# Patient Record
Sex: Male | Born: 1990
Health system: Southern US, Community
[De-identification: ages and names within clinical notes are randomized; demographics above are authoritative.]

---
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
## 2010-02-26 ENCOUNTER — Emergency Department (HOSPITAL_COMMUNITY): Admission: EM | Admit: 2010-02-26 | Discharge: 2010-02-27 | Payer: Self-pay | Admitting: Emergency Medicine

## 2011-02-10 LAB — URINALYSIS, ROUTINE W REFLEX MICROSCOPIC
Glucose, UA: NEGATIVE mg/dL
Hgb urine dipstick: NEGATIVE
Specific Gravity, Urine: 1.02 (ref 1.005–1.030)

## 2011-02-10 LAB — BASIC METABOLIC PANEL
BUN: 7 mg/dL (ref 6–23)
CO2: 25 mEq/L (ref 19–32)
Chloride: 109 mEq/L (ref 96–112)
Creatinine, Ser: 1.25 mg/dL (ref 0.4–1.5)

## 2011-02-10 LAB — ACETAMINOPHEN LEVEL: Acetaminophen (Tylenol), Serum: 22.6 ug/mL (ref 10–30)

## 2011-02-10 LAB — HEPATIC FUNCTION PANEL
ALT: 12 U/L (ref 0–53)
AST: 20 U/L (ref 0–37)
Indirect Bilirubin: 0.4 mg/dL (ref 0.3–0.9)
Total Protein: 7.2 g/dL (ref 6.0–8.3)

## 2011-02-10 LAB — SALICYLATE LEVEL: Salicylate Lvl: <4 mg/dL (ref 2.8–20.0)

## 2011-02-10 LAB — CBC
MCHC: 34.5 g/dL (ref 30.0–36.0)
MCV: 90.1 fL (ref 78.0–100.0)
Platelets: 241 10*3/uL (ref 150–400)
WBC: 5.9 10*3/uL (ref 4.0–10.5)

## 2011-02-10 LAB — ETHANOL: Alcohol, Ethyl (B): <5 mg/dL (ref 0–10)

## 2011-02-10 LAB — DIFFERENTIAL
Basophils Absolute: 0 10*3/uL (ref 0.0–0.1)
Basophils Relative: 0 % (ref 0–1)
Eosinophils Absolute: 0 10*3/uL (ref 0.0–0.7)
Neutrophils Relative %: 58 % (ref 43–77)

## 2011-02-10 LAB — RAPID URINE DRUG SCREEN, HOSP PERFORMED: Barbiturates: NOT DETECTED

## 2011-07-11 ENCOUNTER — Encounter: Payer: Self-pay | Admitting: *Deleted

## 2011-07-11 ENCOUNTER — Emergency Department (HOSPITAL_COMMUNITY)
Admission: EM | Admit: 2011-07-11 | Discharge: 2011-07-11 | Disposition: A | Discharge status: Home / Self Care | Payer: Self-pay | Attending: Emergency Medicine | Admitting: Emergency Medicine

## 2011-07-11 DIAGNOSIS — F17200 Nicotine dependence, unspecified, uncomplicated: Secondary | ICD-10-CM | POA: Insufficient documentation

## 2011-07-11 DIAGNOSIS — Z202 Contact with and (suspected) exposure to infections with a predominantly sexual mode of transmission: Secondary | ICD-10-CM | POA: Insufficient documentation

## 2011-07-11 LAB — URINALYSIS, ROUTINE W REFLEX MICROSCOPIC
Bilirubin Urine: NEGATIVE
Nitrite: NEGATIVE
Specific Gravity, Urine: >1.03 — ABNORMAL HIGH (ref 1.005–1.030)
pH: 6 (ref 5.0–8.0)

## 2011-07-11 MED ORDER — AZITHROMYCIN 250 MG PO TABS
1000.0000 mg | ORAL_TABLET | Freq: Once | ORAL | Status: AC
  Administered 2011-07-11: 1000 mg via ORAL
  Filled 2011-07-11: qty 4

## 2011-07-11 MED ORDER — CEFTRIAXONE SODIUM 1 G IJ SOLR
500.0000 mg | Freq: Once | INTRAMUSCULAR | Status: AC
  Administered 2011-07-11: 500 mg via INTRAMUSCULAR
  Filled 2011-07-11: qty 1
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
## 2011-07-13 LAB — GC/CHLAMYDIA PROBE AMP, GENITAL
Chlamydia, DNA Probe: POSITIVE — AB
GC Probe Amp, Genital: NEGATIVE

## 2011-07-14 NOTE — ED Provider Notes (Signed)
Medical screening examination/treatment/procedure(s) were performed by non-physician practitioner and as supervising physician I was immediately available for consultation/collaboration.   Benny Lennert, MD 07/14/11 [phone]

## 2011-07-21 NOTE — ED Provider Notes (Signed)
Medical screening examination/treatment/procedure(s) were performed by non-physician practitioner and as supervising physician I was immediately available for consultation/collaboration.   Benny Lennert, MD 07/21/11 1043

## 2012-01-19 ENCOUNTER — Emergency Department (HOSPITAL_COMMUNITY): Payer: Self-pay

## 2012-01-19 ENCOUNTER — Encounter (HOSPITAL_COMMUNITY): Payer: Self-pay | Admitting: Emergency Medicine

## 2012-01-19 ENCOUNTER — Emergency Department (HOSPITAL_COMMUNITY)
Admission: EM | Admit: 2012-01-19 | Discharge: 2012-01-19 | Disposition: A | Discharge status: Home / Self Care | Payer: Self-pay | Attending: Emergency Medicine | Admitting: Emergency Medicine

## 2012-01-19 DIAGNOSIS — F17200 Nicotine dependence, unspecified, uncomplicated: Secondary | ICD-10-CM | POA: Insufficient documentation

## 2012-01-19 DIAGNOSIS — M25579 Pain in unspecified ankle and joints of unspecified foot: Secondary | ICD-10-CM | POA: Insufficient documentation

## 2012-01-19 DIAGNOSIS — S93409A Sprain of unspecified ligament of unspecified ankle, initial encounter: Secondary | ICD-10-CM | POA: Insufficient documentation

## 2012-01-19 DIAGNOSIS — X500XXA Overexertion from strenuous movement or load, initial encounter: Secondary | ICD-10-CM | POA: Insufficient documentation

## 2012-01-19 DIAGNOSIS — Y92009 Unspecified place in unspecified non-institutional (private) residence as the place of occurrence of the external cause: Secondary | ICD-10-CM | POA: Insufficient documentation

## 2012-01-19 MED ORDER — IBUPROFEN 800 MG PO TABS: 800.0000 mg | ORAL_TABLET | Freq: Three times a day (TID) | ORAL | Status: AC

## 2012-01-19 MED ORDER — HYDROCODONE-ACETAMINOPHEN 5-325 MG PO TABS: ORAL_TABLET | ORAL | Status: AC

## 2012-01-19 NOTE — Discharge Instructions (Signed)
Ankle Sprain °An ankle sprain is an injury to the ligaments that hold the ankle joint together.  °CAUSES °The injury is usually caused by a fall or by twisting the ankle. It is important to tell your caregiver how the injury occurred and whether or not you were able to walk immediately after the injury.  °SYMPTOMS  °Pain is the primary symptom. It may be present at rest or only when you are trying to stand or walk. The ankle will likely be swollen. Bruising may develop immediately or after 1 or 2 days. It may be difficult or impossible to stand or walk. This depends on the severity of the sprain. °DIAGNOSIS  °Your caregiver can determine if a sprain has occurred based on the accident details and on examination of your ankle. Examination will include pressing and squeezing areas of the foot and ankle. Your caregiver will try to move the ankle in certain ways. X-rays may be used to be sure a bone was not broken, or that the ligament did not pull off of a bone (avulsion). There are standard guidelines that can reliably determine if an X-ray is needed. °TREATMENT  °Rest, ice, elevation, and compression are the basic modes of treatment. Certain types of braces can help stabilize the ankle and allow early return to walking. Your caregiver can make a recommendation for this. Medication may be recommended for pain. You may be referred to an orthopedist or a physical therapist for certain types of severe sprains. °HOME CARE INSTRUCTIONS  °· Apply ice to the sore area for 15 to 20 minutes, 3 to 4 times per day. Do this while you are awake for the first 2 days, or as directed. This can be stopped when the swelling goes away. Put the ice in a plastic bag and place a towel between the bag of ice and your skin.  °· Keep your leg elevated when possible to lessen swelling.  °· If your caregiver recommends crutches, use them as instructed with a non-weight bearing cast for 1 week. Then, you may walk on your ankle as the pain allows,  or as instructed. Gradually, put weight on the affected ankle. Continue to use crutches or a cane until you can walk without causing pain.  °· If a plaster splint was applied, wear the splint until you are seen for a follow-up examination. Rest it on nothing harder than a pillow the first 24 hours. Do not put weight on it. Do not get it wet. You may take it off to take a shower or bath.  °· You may have been given an elastic bandage to use with the plaster splint, or you may have been given a elastic bandage to use alone. The elastic bandage is too tight if you have numbness, tingling, or if your foot becomes cold and blue. Adjust the bandage to make it comfortable.  °· If an air splint was applied, you may blow more air into it or take some out to make it more comfortable. You may take it off at night and to take a shower or bath. Wiggle your toes in the splint several times per day if you are able.  °· Only take over-the-counter or prescription medicines for pain, discomfort, or fever as directed by your caregiver.  °· Do not drive a vehicle until your caregiver specifically tells you it is safe to do so.  °SEEK MEDICAL CARE IF:  °· You have an increase in bruising, swelling, or pain.  °· Your   toes feel cold.  °· Pain relief is not achieved with medications.  °SEEK IMMEDIATE MEDICAL CARE IF: °Your toes are numb or blue or you have severe pain. °MAKE SURE YOU:  °· Understand these instructions.  °· Will watch your condition.  °· Will get help right away if you are not doing well or get worse.  °Document Released: 11/08/2005 Document Revised: 02/12/2011 Document Reviewed: 06/12/2008 °[employer]® Patient Information ©2012 [employer]. °

## 2012-01-19 NOTE — ED Provider Notes (Signed)
History     CSN: [id number]  Arrival date & time 01/19/12  1844   First MD Initiated Contact with Patient 01/19/12 1944      Chief Complaint  Patient presents with  . Extremity Pain    (Consider location/radiation/quality/duration/timing/severity/associated sxs/prior treatment) HPI Comments: Patient c/o pain to his right ankle for one week.  Pain began after he stepped in a hole.  He denies other injuries.  Patient is a 21 y.o. male presenting with ankle pain. The history is provided by the patient. No language interpreter was used.  Ankle Pain  The incident occurred more than 1 week ago. The incident occurred at home. The injury mechanism was torsion. The pain is present in the right ankle. The pain is mild. The pain has been constant since onset. Pertinent negatives include no numbness, no inability to bear weight, no loss of motion, no muscle weakness, no loss of sensation and no tingling. He reports no foreign bodies present. The symptoms are aggravated by activity, bearing weight and palpation. He has tried nothing for the symptoms. The treatment provided no relief.    History reviewed. No pertinent past medical history.  History reviewed. No pertinent past surgical history.  History reviewed. No pertinent family history.  History  Substance Use Topics  . Smoking status: Current Everyday Smoker -- 1.0 packs/day  . Smokeless tobacco: Not on file  . Alcohol Use: No      Review of Systems  Musculoskeletal: Positive for arthralgias. Negative for joint swelling and gait problem.  Skin: Negative.   Neurological: Negative for tingling, weakness and numbness.  All other systems reviewed and are negative.    Allergies  Review of patient's allergies indicates no known allergies.  Home Medications  No current outpatient prescriptions on file.  BP 111/64  Pulse 64  Temp(Src) 98.5 F (36.9 C) (Oral)  Resp 20  Ht 5\' 6"  (1.676 m)  Wt 135 lb (61.236 kg)  BMI 21.79 kg/m2   SpO2 100%  Physical Exam  Nursing note and vitals reviewed. Constitutional: He is oriented to person, place, and time. He appears well-developed and well-nourished. No distress.  HENT:  Head: Normocephalic and atraumatic.  Musculoskeletal: He exhibits tenderness. He exhibits no edema.       Right ankle: He exhibits normal range of motion, no swelling, no ecchymosis, no deformity, no laceration and normal pulse. tenderness. Lateral malleolus tenderness found. No medial malleolus and no proximal fibula tenderness found. Achilles tendon exhibits pain. Achilles tendon exhibits no defect and normal Thompson's test results.       Feet:  Neurological: He is alert and oriented to person, place, and time. He exhibits normal muscle tone. Coordination normal.  Skin: Skin is warm and dry.    ED Course  Procedures (including critical care time)  Labs Reviewed - No data to display Dg Ankle Complete Right  01/19/2012  *RADIOLOGY REPORT*  Clinical Data: 21 year old male with trauma 1 week ago and continued lateral ankle pain.  RIGHT ANKLE - COMPLETE 3+ VIEW  Comparison: None.  Findings: No evidence of joint effusion.  Calcaneus intact.  Normal mortise joint alignment.  Talar dome intact.  No fracture dislocation identified.  IMPRESSION: No acute fracture or dislocation identified about the right ankle.  Original Report Authenticated By: Ulla Potash III, M.D.      ASO splint applied to the right ankle.  Pain improved.  No edema. Remains NV intact.    MDM      Patient has tenderness to  palpation of the lateral right ankle and along the Achilles tendon. There's no obvious Achilles tendon rupture. Patient has full dorsiflexion and plantar flexion of the right foot. DP pulse is brisk, distal sensation intact.  ASO has been applied and pain is improved he agrees to close followup with Dr. Mort Sawyers office  Pt feels improved after observation and/or treatment in ED. Patient / Family / Caregiver  understand and agree with initial ED impression and plan with expectations set for ED visit. Pt stable in ED with no significant deterioration in condition.   Herb Beltre L. [place], [place] 01/21/12 2142

## 2012-01-19 NOTE — ED Notes (Signed)
States he stepped in a hole over a week ago, pain in right ankle

## 2012-01-21 NOTE — ED Provider Notes (Signed)
Medical screening examination/treatment/procedure(s) were performed by non-physician practitioner and as supervising physician I was immediately available for consultation/collaboration.  Markham Dumlao R. Wynelle Dreier, MD 01/21/12 2347 

## 2012-05-27 ENCOUNTER — Encounter (HOSPITAL_COMMUNITY): Payer: Self-pay | Admitting: *Deleted

## 2012-05-27 ENCOUNTER — Emergency Department (HOSPITAL_COMMUNITY)
Admission: EM | Admit: 2012-05-27 | Discharge: 2012-05-27 | Disposition: A | Discharge status: Home / Self Care | Payer: Self-pay | Attending: Emergency Medicine | Admitting: Emergency Medicine

## 2012-05-27 DIAGNOSIS — L089 Local infection of the skin and subcutaneous tissue, unspecified: Secondary | ICD-10-CM | POA: Insufficient documentation

## 2012-05-27 DIAGNOSIS — M79672 Pain in left foot: Secondary | ICD-10-CM

## 2012-05-27 DIAGNOSIS — F17200 Nicotine dependence, unspecified, uncomplicated: Secondary | ICD-10-CM | POA: Insufficient documentation

## 2012-05-27 MED ORDER — SULFAMETHOXAZOLE-TMP DS 800-160 MG PO TABS
1.0000 | ORAL_TABLET | Freq: Once | ORAL | Status: AC
  Administered 2012-05-27: 1 via ORAL
  Filled 2012-05-27: qty 1

## 2012-05-27 MED ORDER — SULFAMETHOXAZOLE-TRIMETHOPRIM 800-160 MG PO TABS: 1.0000 | ORAL_TABLET | Freq: Two times a day (BID) | ORAL | Status: AC

## 2012-05-27 NOTE — ED Notes (Signed)
Pt reports that he believes 2 toes on left foot are infected.  No swelling or redness noted to area, nail on great toe cut very short. Reports pain began about 1 week ago. Reports he did have some drainage yesterday.

## 2012-05-27 NOTE — ED Provider Notes (Signed)
History     CSN: [id number]  Arrival date & time 05/27/12  2017   First MD Initiated Contact with Patient 05/27/12 2109      No chief complaint on file.   (Consider location/radiation/quality/duration/timing/severity/associated sxs/prior treatment) HPI Comments: Patient reports problem with the left first toe area patient states that this pain started approximately a week ago after he cut his toenail. He thinks he may have cut in a little bit too short. In the last few days he has noted some mild drainage from the area. He became concerned about infection and presents to the emergency department. The patient also noted some swelling of the tip of the second toe of the left foot. He does not recall any injury. He noticed that the toenail is black. And he requests to have this evaluated as well. There's been no fever or chills. There's been no nausea vomiting. No previous procedures involving the left foot.  The history is provided by the patient.    History reviewed. No pertinent past medical history.  History reviewed. No pertinent past surgical history.  History reviewed. No pertinent family history.  History  Substance Use Topics  . Smoking status: Current Everyday Smoker -- 1.0 packs/day  . Smokeless tobacco: Not on file  . Alcohol Use: No      Review of Systems  Constitutional: Negative for activity change.       All ROS Neg except as noted in HPI  HENT: Negative for nosebleeds and neck pain.   Eyes: Negative for photophobia and discharge.  Respiratory: Negative for cough, shortness of breath and wheezing.   Cardiovascular: Negative for chest pain and palpitations.  Gastrointestinal: Negative for abdominal pain and blood in stool.  Genitourinary: Negative for dysuria, frequency and hematuria.  Musculoskeletal: Negative for back pain and arthralgias.  Skin: Negative.   Neurological: Negative for dizziness, seizures and speech difficulty.  Psychiatric/Behavioral:  Negative for hallucinations and confusion.    Allergies  Review of patient's allergies indicates no known allergies.  Home Medications   Current Outpatient Rx  Name Route Sig Dispense Refill  . SULFAMETHOXAZOLE-TRIMETHOPRIM 800-160 MG PO TABS Oral Take 1 tablet by mouth every 12 (twelve) hours. 14 tablet 0    BP 130/70  Pulse 67  Temp 97.3 F (36.3 C) (Oral)  Resp 20  Ht 5\' 6"  (1.676 m)  Wt 135 lb (61.236 kg)  BMI 21.79 kg/m2  SpO2 99%  Physical Exam  Nursing note and vitals reviewed. Constitutional: He is oriented to person, place, and time. He appears well-developed and well-nourished.  Non-toxic appearance.  HENT:  Head: Normocephalic.  Right Ear: Tympanic membrane and external ear normal.  Left Ear: Tympanic membrane and external ear normal.  Eyes: EOM and lids are normal. Pupils are equal, round, and reactive to light.  Neck: Normal range of motion. Neck supple. Carotid bruit is not present.  Cardiovascular: Normal rate, regular rhythm, normal heart sounds, intact distal pulses and normal pulses.   Pulmonary/Chest: Breath sounds normal. No respiratory distress.  Abdominal: Soft. Bowel sounds are normal. There is no tenderness. There is no guarding.  Musculoskeletal: Normal range of motion.       There is increased redness and some raw exposed area under the nail of the left first toe. There is no red streaking present. There is no drainage present at this time there is full range of motion of the toe. There no lesions between the toes. The left second toe is Mildly swollen at the distal  portion. The nail is black but there is no change in the tissue around the nail. There is good capillary refill. There is no red streaking present. The dorsalis pedis pulse is 2+ and symmetrical  Lymphadenopathy:       Head (right side): No submandibular adenopathy present.       Head (left side): No submandibular adenopathy present.    He has no cervical adenopathy.  Neurological: He is  alert and oriented to person, place, and time. He has normal strength. No cranial nerve deficit or sensory deficit.  Skin: Skin is warm and dry.  Psychiatric: He has a normal mood and affect. His speech is normal.    ED Course  Procedures (including critical care time)  Labs Reviewed - No data to display No results found.   1. Foot pain, left   2. Skin infection       MDM  I have reviewed nursing notes, vital signs, and all appropriate lab and imaging results for this patient. Patient has an early infection under the nail of the left first toe. And some swelling of the left second toe. Patient is placed on Septra DS one twice daily. He is asked to soak the left foot in warm salt water daily. And he is advised to return to the emergency department or see his primary care physician if not improving.       Kathie Dike, [place] 05/27/12 2128

## 2012-05-28 NOTE — ED Provider Notes (Signed)
Medical screening examination/treatment/procedure(s) were performed by non-physician practitioner and as supervising physician I was immediately available for consultation/collaboration.   Laray Anger, DO 05/28/12 [phone]

## 2012-08-17 ENCOUNTER — Encounter (HOSPITAL_COMMUNITY): Payer: Self-pay

## 2012-08-17 ENCOUNTER — Emergency Department (HOSPITAL_COMMUNITY): Payer: Self-pay

## 2012-08-17 ENCOUNTER — Emergency Department (HOSPITAL_COMMUNITY)
Admission: EM | Admit: 2012-08-17 | Discharge: 2012-08-17 | Disposition: A | Discharge status: Home / Self Care | Payer: Self-pay | Attending: Emergency Medicine | Admitting: Emergency Medicine

## 2012-08-17 DIAGNOSIS — S63502A Unspecified sprain of left wrist, initial encounter: Secondary | ICD-10-CM

## 2012-08-17 DIAGNOSIS — S0181XA Laceration without foreign body of other part of head, initial encounter: Secondary | ICD-10-CM

## 2012-08-17 DIAGNOSIS — S0180XA Unspecified open wound of other part of head, initial encounter: Secondary | ICD-10-CM | POA: Insufficient documentation

## 2012-08-17 DIAGNOSIS — S63509A Unspecified sprain of unspecified wrist, initial encounter: Secondary | ICD-10-CM | POA: Insufficient documentation

## 2012-08-17 MED ORDER — IBUPROFEN 800 MG PO TABS
800.0000 mg | ORAL_TABLET | Freq: Once | ORAL | Status: AC
  Administered 2012-08-17: 800 mg via ORAL
  Filled 2012-08-17: qty 1

## 2012-08-17 MED ORDER — PROMETHAZINE HCL 12.5 MG PO TABS
12.5000 mg | ORAL_TABLET | Freq: Once | ORAL | Status: AC
  Administered 2012-08-17: 12.5 mg via ORAL

## 2012-08-17 MED ORDER — HYDROCODONE-ACETAMINOPHEN 7.5-325 MG PO TABS: 1.0000 | ORAL_TABLET | ORAL | Status: AC | PRN

## 2012-08-17 MED ORDER — TETANUS-DIPHTH-ACELL PERTUSSIS 5-2.5-18.5 LF-MCG/0.5 IM SUSP
0.5000 mL | Freq: Once | INTRAMUSCULAR | Status: AC
  Administered 2012-08-17: 0.5 mL via INTRAMUSCULAR
  Filled 2012-08-17: qty 0.5

## 2012-08-17 MED ORDER — IBUPROFEN 800 MG PO TABS: 800.0000 mg | ORAL_TABLET | Freq: Three times a day (TID) | ORAL | Status: AC

## 2012-08-17 MED ORDER — HYDROCODONE-ACETAMINOPHEN 5-325 MG PO TABS
2.0000 | ORAL_TABLET | Freq: Once | ORAL | Status: AC
  Administered 2012-08-17: 2 via ORAL
  Filled 2012-08-17: qty 2
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
## 2012-08-17 NOTE — ED Notes (Signed)
Patient with no complaints at this time. Respirations even and unlabored. Skin warm/dry. Discharge instructions reviewed with patient at this time. Patient given opportunity to voice concerns/ask questions. Patient discharged at this time and left Emergency Department with steady gait.   

## 2012-08-17 NOTE — ED Notes (Signed)
L wrist w/obvious deformity, swollen and tender.  Also has laceration to L chin.
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
## 2012-08-19 NOTE — ED Provider Notes (Signed)
Medical screening examination/treatment/procedure(s) were performed by non-physician practitioner and as supervising physician I was immediately available for consultation/collaboration.  Shanielle Correll L Gearldean [place], MD 08/19/12 1258 

## 2013-11-11 ENCOUNTER — Emergency Department (HOSPITAL_COMMUNITY)
Admission: EM | Admit: 2013-11-11 | Discharge: 2013-11-11 | Disposition: A | Discharge status: Home / Self Care | Payer: Self-pay | Attending: Emergency Medicine | Admitting: Emergency Medicine

## 2013-11-11 ENCOUNTER — Encounter (HOSPITAL_COMMUNITY): Payer: Self-pay | Admitting: Emergency Medicine

## 2013-11-11 DIAGNOSIS — Z791 Long term (current) use of non-steroidal anti-inflammatories (NSAID): Secondary | ICD-10-CM | POA: Insufficient documentation

## 2013-11-11 DIAGNOSIS — F17200 Nicotine dependence, unspecified, uncomplicated: Secondary | ICD-10-CM | POA: Insufficient documentation

## 2013-11-11 DIAGNOSIS — K089 Disorder of teeth and supporting structures, unspecified: Secondary | ICD-10-CM | POA: Insufficient documentation

## 2013-11-11 DIAGNOSIS — K029 Dental caries, unspecified: Secondary | ICD-10-CM

## 2013-11-11 MED ORDER — HYDROCODONE-ACETAMINOPHEN 5-325 MG PO TABS: 1.0000 | ORAL_TABLET | Freq: Four times a day (QID) | ORAL | Status: DC | PRN

## 2013-11-11 MED ORDER — PENICILLIN V POTASSIUM 500 MG PO TABS: 500.0000 mg | ORAL_TABLET | Freq: Four times a day (QID) | ORAL | Status: AC

## 2013-11-11 MED ORDER — PENICILLIN V POTASSIUM 250 MG PO TABS
500.0000 mg | ORAL_TABLET | Freq: Once | ORAL | Status: AC
  Administered 2013-11-11: 500 mg via ORAL

## 2013-11-11 MED ORDER — HYDROCODONE-ACETAMINOPHEN 5-325 MG PO TABS: 1.0000 | ORAL_TABLET | Freq: Four times a day (QID) | ORAL | Status: AC | PRN

## 2013-11-11 MED ORDER — PENICILLIN V POTASSIUM 250 MG PO TABS
ORAL_TABLET | ORAL | Status: AC
  Filled 2013-11-11: qty 2

## 2013-11-11 NOTE — ED Provider Notes (Signed)
CSN: [id number]     Arrival date & time 11/11/13  0139 History   First MD Initiated Contact with Patient 11/11/13 0211     Chief Complaint  Patient presents with  . Dental Pain   (Consider location/radiation/quality/duration/timing/severity/associated sxs/prior Treatment) HPI This is a 22 year old male with a two-day history of a toothache. He states that the pain is "very bad". It is worse with percussion or closing his mouth but is not worse with eating or drinking. The pain is sharp. There is no associated fever, chills or swelling. He does not have a dentist.  History reviewed. No pertinent past medical history. History reviewed. No pertinent past surgical history. No family history on file. History  Substance Use Topics  . Smoking status: Current Every Day Smoker -- 1.00 packs/day  . Smokeless tobacco: Not on file  . Alcohol Use: No    Review of Systems  All other systems reviewed and are negative.    Allergies  Review of patient's allergies indicates no known allergies.  Home Medications   Current Outpatient Rx  Name  Route  Sig  Dispense  Refill  . ibuprofen (ADVIL,MOTRIN) 800 MG tablet   Oral   Take 1 tablet (800 mg total) by mouth 3 (three) times daily.   21 tablet   0    BP 128/85  Pulse 53  Temp(Src) 98.1 F (36.7 C) (Oral)  Resp 16  Ht 5\' 5"  (1.651 m)  Wt 127 lb (57.607 kg)  BMI 21.13 kg/m2  SpO2 100%  Physical Exam General: Well-developed, well-nourished male in no acute distress; appearance consistent with age of record HENT: normocephalic; atraumatic; carious right lower third molar with tenderness to percussion Eyes: pupils equal, round and reactive to light; extraocular muscles intact Neck: supple; no lymphadenopathy Heart: regular rate and rhythm; no murmurs, rubs or gallops Lungs: clear to auscultation bilaterally Abdomen: soft; nondistended; nontender Extremities: No deformity; full range of motion Neurologic: Awake, alert; motor  function intact in all extremities and symmetric; no facial droop Skin: Warm and dry Psychiatric: Normal mood and affect    ED Course  Procedures (including critical care time)  MDM  Patient given list of dental resources.    Hanley Seamen, MD 11/11/13 [phone]

## 2013-11-11 NOTE — ED Notes (Signed)
Discharge instructions given and reviewed with patient.  Prescriptions given for Pencillin VK and Hydrocodone; effects and use explained.  Patient verbalized understanding to follow up with dentist.  Vicodin pre-pack given at discharge.  Patient ambulatory; discharged home in good condition.

## 2013-11-11 NOTE — ED Notes (Signed)
Patient c/o right lower tooth pain x 2 days.

## 2013-11-21 MED FILL — Hydrocodone-Acetaminophen Tab 5-325 MG: ORAL | Qty: 6 | Status: AC

## 2014-11-05 ENCOUNTER — Emergency Department (HOSPITAL_COMMUNITY)
Admission: EM | Admit: 2014-11-05 | Discharge: 2014-11-06 | Disposition: A | Discharge status: Home / Self Care | Payer: Self-pay | Attending: Emergency Medicine | Admitting: Emergency Medicine

## 2014-11-05 ENCOUNTER — Emergency Department (HOSPITAL_COMMUNITY): Payer: Self-pay

## 2014-11-05 ENCOUNTER — Encounter (HOSPITAL_COMMUNITY): Payer: Self-pay | Admitting: *Deleted

## 2014-11-05 DIAGNOSIS — M25571 Pain in right ankle and joints of right foot: Secondary | ICD-10-CM

## 2014-11-05 DIAGNOSIS — Y9289 Other specified places as the place of occurrence of the external cause: Secondary | ICD-10-CM | POA: Insufficient documentation

## 2014-11-05 DIAGNOSIS — Z720 Tobacco use: Secondary | ICD-10-CM | POA: Insufficient documentation

## 2014-11-05 DIAGNOSIS — S99911A Unspecified injury of right ankle, initial encounter: Secondary | ICD-10-CM | POA: Insufficient documentation

## 2014-11-05 DIAGNOSIS — M25572 Pain in left ankle and joints of left foot: Secondary | ICD-10-CM

## 2014-11-05 DIAGNOSIS — Z791 Long term (current) use of non-steroidal anti-inflammatories (NSAID): Secondary | ICD-10-CM | POA: Insufficient documentation

## 2014-11-05 DIAGNOSIS — Y9389 Activity, other specified: Secondary | ICD-10-CM | POA: Insufficient documentation

## 2014-11-05 DIAGNOSIS — Y990 Civilian activity done for income or pay: Secondary | ICD-10-CM | POA: Insufficient documentation

## 2014-11-05 DIAGNOSIS — S99912A Unspecified injury of left ankle, initial encounter: Secondary | ICD-10-CM | POA: Insufficient documentation

## 2014-11-05 DIAGNOSIS — W228XXA Striking against or struck by other objects, initial encounter: Secondary | ICD-10-CM | POA: Insufficient documentation

## 2014-11-05 MED ORDER — IBUPROFEN 400 MG PO TABS
600.0000 mg | ORAL_TABLET | Freq: Once | ORAL | Status: AC
  Administered 2014-11-06: 600 mg via ORAL
  Filled 2014-11-05: qty 2

## 2014-11-05 NOTE — ED Notes (Signed)
bil lower leg pain for 3-4 days,injury at work, 1 week ago.

## 2014-11-05 NOTE — ED Provider Notes (Signed)
CSN: [id number]     Arrival date & time 11/05/14  2240 History   First MD Initiated Contact with Patient 11/05/14 2300     Chief Complaint  Patient presents with  . Leg Pain     (Consider location/radiation/quality/duration/timing/severity/associated sxs/prior Treatment) Patient is a 23 y.o. male presenting with leg pain. The history is provided by the patient.  Leg Pain Location:  Leg Time since incident:  4 days Injury: yes   Leg location:  L leg and R leg Pain details:    Quality:  Aching   Radiates to:  Does not radiate   Severity:  Moderate   Timing:  Constant Chronicity:  New Dislocation: no   Foreign body present:  No foreign bodies Prior injury to area:  No Relieved by:  None tried Worsened by:  Activity Ineffective treatments:  None tried  Jose Blair is a 23 y.o. male who presents to the ED with leg pain that started about a weeks ago. He states that he injured his left ankle when he was at work and a metal stand hit it. He also does a lot of walking at work and complains that both of his lower legs hurt. He denies swelling, bruising or other problems.   History reviewed. No pertinent past medical history. History reviewed. No pertinent past surgical history. History reviewed. No pertinent family history. History  Substance Use Topics  . Smoking status: Current Every Day Smoker -- 1.00 packs/day    Types: Cigarettes  . Smokeless tobacco: Not on file  . Alcohol Use: No    Review of Systems Negative except as stated in HPI   Allergies  Review of patient's allergies indicates no known allergies.  Home Medications   Prior to Admission medications   Medication Sig Start Date End Date Taking? Authorizing Provider  HYDROcodone-acetaminophen (NORCO) 5-325 MG per tablet Take 1-2 tablets by mouth every 6 (six) hours as needed (for pain). 11/11/13   Carlisle BeersJohn L Molpus, MD  HYDROcodone-acetaminophen (NORCO) 5-325 MG per tablet Take 1-2 tablets by mouth every 6  (six) hours as needed (for pain). 11/11/13   Carlisle BeersJohn L Molpus, MD  ibuprofen (ADVIL,MOTRIN) 800 MG tablet Take 1 tablet (800 mg total) by mouth 3 (three) times daily. 08/17/12   Kathie DikeHobson M Bryant, PA-C   BP 110/76 mmHg  Pulse 64  Temp(Src) 98.4 F (36.9 C) (Oral)  Resp 18  Ht 5\' 5"  (1.651 m)  Wt 129 lb (58.514 kg)  BMI 21.47 kg/m2  SpO2 100% Physical Exam  Constitutional: He is oriented to person, place, and time. He appears well-developed and well-nourished.  HENT:  Head: Normocephalic.  Eyes: EOM are normal.  Neck: Normal range of motion. Neck supple.  Cardiovascular: Normal rate.   Pulmonary/Chest: Effort normal.  Musculoskeletal:       Left ankle: He exhibits normal range of motion, no swelling, no ecchymosis, no deformity, no laceration and normal pulse. Tenderness. Lateral malleolus tenderness found. Achilles tendon normal.       Legs: Pedal pulses 2+ and equal, adequate circulation, good touch sensation. Tender on palpation bilateral lower legs and ankles. No deformity or neurovascular deficits. No calf tenderness.   Neurological: He is alert and oriented to person, place, and time. No cranial nerve deficit.  Skin: Skin is warm and dry.  Psychiatric: He has a normal mood and affect. His behavior is normal.  Nursing note and vitals reviewed.   ED Course  Procedures  Dg Ankle Complete Left  11/06/2014  CLINICAL DATA:  Acute left ankle pain after injury at work.  EXAM: LEFT ANKLE COMPLETE - 3+ VIEW  COMPARISON:  None.  FINDINGS: There is no evidence of fracture, dislocation, or joint effusion. There is no evidence of arthropathy or other focal bone abnormality. Soft tissues are unremarkable.  IMPRESSION: Normal left ankle.   Electronically Signed   By: Roque LiasJames  Green M.D.   On: 11/06/2014 01:17    MDM  23 y.o. male with left ankle pain s/p injury one week ago and bilateral ankle pain after walking a lot at work. Stable for discharge without neurovascular deficits and normal x-ray  of the left ankle. ASO to left ankle, ice, ibuprofen.   Final diagnoses:  Acute left ankle pain      Jose NapoleonHope M Martesha Niedermeier, NP 11/06/14 0154  Ward GivensIva L Knapp, MD 11/06/14 [phone]

## 2014-11-06 MED ORDER — NAPROXEN 500 MG PO TABS: 500.0000 mg | ORAL_TABLET | Freq: Two times a day (BID) | ORAL | Status: DC

## 2014-11-06 NOTE — ED Notes (Signed)
Pt left ED, ambulatory with no sign of distress. Pt verbalized discharge instructions. 

## 2015-05-14 ENCOUNTER — Encounter (HOSPITAL_COMMUNITY): Payer: Self-pay | Admitting: Emergency Medicine

## 2015-05-14 ENCOUNTER — Emergency Department (HOSPITAL_COMMUNITY)
Admission: EM | Admit: 2015-05-14 | Discharge: 2015-05-15 | Disposition: A | Discharge status: Home / Self Care | Payer: Self-pay | Attending: Emergency Medicine | Admitting: Emergency Medicine

## 2015-05-14 DIAGNOSIS — Z720 Tobacco use: Secondary | ICD-10-CM | POA: Insufficient documentation

## 2015-05-14 DIAGNOSIS — Y929 Unspecified place or not applicable: Secondary | ICD-10-CM | POA: Insufficient documentation

## 2015-05-14 DIAGNOSIS — Z791 Long term (current) use of non-steroidal anti-inflammatories (NSAID): Secondary | ICD-10-CM | POA: Insufficient documentation

## 2015-05-14 DIAGNOSIS — Y998 Other external cause status: Secondary | ICD-10-CM | POA: Insufficient documentation

## 2015-05-14 DIAGNOSIS — S82432A Displaced oblique fracture of shaft of left fibula, initial encounter for closed fracture: Secondary | ICD-10-CM | POA: Insufficient documentation

## 2015-05-14 DIAGNOSIS — Y9389 Activity, other specified: Secondary | ICD-10-CM | POA: Insufficient documentation

## 2015-05-14 DIAGNOSIS — S82402A Unspecified fracture of shaft of left fibula, initial encounter for closed fracture: Secondary | ICD-10-CM

## 2015-05-14 DIAGNOSIS — X58XXXA Exposure to other specified factors, initial encounter: Secondary | ICD-10-CM | POA: Insufficient documentation
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
## 2015-05-15 ENCOUNTER — Emergency Department (HOSPITAL_COMMUNITY)
Admission: EM | Admit: 2015-05-15 | Discharge: 2015-05-15 | Disposition: A | Discharge status: Home / Self Care | Payer: Self-pay | Attending: Emergency Medicine | Admitting: Emergency Medicine

## 2015-05-15 ENCOUNTER — Emergency Department (HOSPITAL_COMMUNITY): Payer: Self-pay

## 2015-05-15 ENCOUNTER — Encounter (HOSPITAL_COMMUNITY): Payer: Self-pay | Admitting: Emergency Medicine

## 2015-05-15 DIAGNOSIS — Z79899 Other long term (current) drug therapy: Secondary | ICD-10-CM | POA: Insufficient documentation

## 2015-05-15 DIAGNOSIS — Z23 Encounter for immunization: Secondary | ICD-10-CM | POA: Insufficient documentation

## 2015-05-15 DIAGNOSIS — S41151A Open bite of right upper arm, initial encounter: Secondary | ICD-10-CM | POA: Insufficient documentation

## 2015-05-15 DIAGNOSIS — W540XXA Bitten by dog, initial encounter: Secondary | ICD-10-CM | POA: Insufficient documentation

## 2015-05-15 DIAGNOSIS — Z720 Tobacco use: Secondary | ICD-10-CM | POA: Insufficient documentation

## 2015-05-15 DIAGNOSIS — Y9289 Other specified places as the place of occurrence of the external cause: Secondary | ICD-10-CM | POA: Insufficient documentation

## 2015-05-15 DIAGNOSIS — Y9389 Activity, other specified: Secondary | ICD-10-CM | POA: Insufficient documentation

## 2015-05-15 DIAGNOSIS — Y998 Other external cause status: Secondary | ICD-10-CM | POA: Insufficient documentation

## 2015-05-15 MED ORDER — ONDANSETRON HCL 4 MG PO TABS
4.0000 mg | ORAL_TABLET | Freq: Once | ORAL | Status: AC
  Administered 2015-05-15: 4 mg via ORAL
  Filled 2015-05-15: qty 1

## 2015-05-15 MED ORDER — AMOXICILLIN-POT CLAVULANATE 875-125 MG PO TABS
1.0000 | ORAL_TABLET | Freq: Once | ORAL | Status: AC
  Administered 2015-05-15: 1 via ORAL
  Filled 2015-05-15: qty 1

## 2015-05-15 MED ORDER — NAPROXEN 500 MG PO TABS: ORAL_TABLET | ORAL | Status: DC

## 2015-05-15 MED ORDER — OXYCODONE-ACETAMINOPHEN 5-325 MG PO TABS: 1.0000 | ORAL_TABLET | Freq: Four times a day (QID) | ORAL | Status: DC | PRN

## 2015-05-15 MED ORDER — TETANUS-DIPHTH-ACELL PERTUSSIS 5-2.5-18.5 LF-MCG/0.5 IM SUSP
0.5000 mL | Freq: Once | INTRAMUSCULAR | Status: AC
  Administered 2015-05-15: 0.5 mL via INTRAMUSCULAR
  Filled 2015-05-15: qty 0.5

## 2015-05-15 MED ORDER — IBUPROFEN 800 MG PO TABS
800.0000 mg | ORAL_TABLET | Freq: Once | ORAL | Status: AC
  Administered 2015-05-15: 800 mg via ORAL
  Filled 2015-05-15: qty 1

## 2015-05-15 MED ORDER — OXYCODONE-ACETAMINOPHEN 5-325 MG PO TABS
1.0000 | ORAL_TABLET | Freq: Once | ORAL | Status: AC
Start: 2015-05-15 — End: 2015-05-15
  Administered 2015-05-15: 1 via ORAL
  Filled 2015-05-15: qty 1

## 2015-05-15 MED ORDER — AMOXICILLIN-POT CLAVULANATE 875-125 MG PO TABS: 1.0000 | ORAL_TABLET | Freq: Two times a day (BID) | ORAL | Status: AC

## 2015-05-15 MED ORDER — NAPROXEN 250 MG PO TABS
500.0000 mg | ORAL_TABLET | Freq: Once | ORAL | Status: AC
  Administered 2015-05-15: 500 mg via ORAL
  Filled 2015-05-15: qty 2

## 2015-05-15 NOTE — ED Notes (Signed)
Pt made aware to return if symptoms worsen or if any life threatening symptoms occur.   

## 2015-05-15 NOTE — ED Notes (Signed)
Notified animal control.

## 2015-05-15 NOTE — ED Notes (Signed)
Pt. Reports pain to left ankle. Pt. Reports unable to bear weight. Swelling noted to left ankle, ice pack applied.
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
## 2015-05-15 NOTE — Discharge Instructions (Signed)
Please cleanse the wound daily with soap and water. Please apply a bandage until the wound has healed. Please use Augmentin with a meal daily until all taken. Someone from the animal control services will contact you concerning the rabies status of the dog bit itchy. Animal Bite Animal bite wounds can get infected. It is important to get proper medical treatment. Ask your doctor if you need a rabies shot. HOME CARE   Follow your doctor's instructions for taking care of your wound.  Only take medicine as told by your doctor.  Take your medicine (antibiotics) as told. Finish them even if you start to feel better.  Keep all doctor visits as told. You may need a tetanus shot if:   You cannot remember when you had your last tetanus shot.  You have never had a tetanus shot.  The injury broke your skin. If you need a tetanus shot and you choose not to have one, you may get tetanus. Sickness from tetanus can be serious. GET HELP RIGHT AWAY IF:   Your wound is warm, red, sore, or puffy (swollen).  You notice yellowish-white fluid (pus) or a bad smell coming from the wound.  You see a red line on the skin coming from the wound.  You have a fever, chills, or you feel sick.  You feel sick to your stomach (nauseous), or you throw up (vomit).  Your pain does not go away, or it gets worse.  You have trouble moving the injured part.  You have questions or concerns. MAKE SURE YOU:   Understand these instructions.  Will watch your condition.  Will get help right away if you are not doing well or get worse. Document Released: 11/08/2005 Document Revised: 01/31/2012 Document Reviewed: 06/30/2011 [facility] Patient Information 2015 [place], [place]. This information is not intended to replace advice given to you by your health care provider. Make sure you discuss any questions you have with your health care provider.

## 2015-05-15 NOTE — ED Notes (Signed)
Ice applied to are

## 2015-05-15 NOTE — Discharge Instructions (Signed)
Elevate your foot. Wear the cam walker the majority of the time. You can remove it to shower. Use ice packs over the swollen areas until the swelling is gone. Call Dr Sanjuan Dame office today to get an appointment in the next week to follow your fracture.    Fibular Fracture, Ankle, Adult, Undisplaced, Treated With Immobilization A simple fracture of the bone below the knee on the outside of your leg (fibula) usually heals without problems. CAUSES Typically, a fibular fracture occurs as a result of trauma. A blow to the side of your leg or a powerful twisting movement can cause a fracture. Fibular fractures are often seen as a result of football, soccer, or skiing injuries. SYMPTOMS Symptoms of a fibular fracture can include:  Pain.  Shortening or abnormal alignment of your lower leg (angulation). DIAGNOSIS A health care provider will need to examine the leg. X-ray exams will be ordered for further to confirm the fracture and evaluate the extent and of the injury. TREATMENT  Typically, a cast or immobilizer is applied. Sometimes a splint is placed on these fractures if it is needed for comfort or if the bones are badly out of place. Crutches may be needed to help you get around.  HOME CARE INSTRUCTIONS   Apply ice to the injured area:  Put ice in a plastic bag.  Place a towel between your skin and the bag.  Leave the ice on for 20 minutes, 2-3 times a day.  Use crutches as directed. Resume walking without crutches as directed by your health care provider or when comfortable doing so.  Only take over-the-counter or prescription medicines for pain, discomfort, or fever as directed by your health care provider.  Keeping your leg raised may lessen swelling.  If you have a removable splint or boot, do not remove the boot unless directed by your health care provider.  Do not not drive a car or operate a motor vehicle until your health care provider specifically tells you it is safe to do  so. SEEK IMMEDIATE MEDICAL CARE IF:   Your cast gets damaged or breaks.  You have continued severe pain or more swelling than you did before the cast was put on, or the pain is not controlled with medications.  Your skin or nails below the injury turn blue or grey, or feel cold or numb.  There is a bad smell or pus coming from under the cast.  You develop severe pain in ankle or foot. MAKE SURE YOU:   Understand these instructions.  Will watch your condition.  Will get help right away if you are not doing well or get worse. Document Released: 07/31/2002 Document Revised: 08/29/2013 Document Reviewed: 06/20/2013 [facility] Patient Information 2015 [place], [place]. This information is not intended to replace advice given to you by your health care provider. Make sure you discuss any questions you have with your health care provider.

## 2015-05-15 NOTE — ED Notes (Signed)
Pt was bitten on right arm by neighborhood dog. [address] [place], [place]. Unknown vaccinations.

## 2015-05-20 ENCOUNTER — Ambulatory Visit (INDEPENDENT_AMBULATORY_CARE_PROVIDER_SITE_OTHER): Payer: Self-pay | Admitting: Orthopedic Surgery

## 2015-05-20 ENCOUNTER — Encounter: Payer: Self-pay | Admitting: Orthopedic Surgery

## 2015-05-20 VITALS — BP 117/68 | Ht 66.0 in | Wt 135.0 lb

## 2015-05-20 DIAGNOSIS — S8262XA Displaced fracture of lateral malleolus of left fibula, initial encounter for closed fracture: Secondary | ICD-10-CM

## 2015-05-20 MED ORDER — OXYCODONE-ACETAMINOPHEN 5-325 MG PO TABS: 1.0000 | ORAL_TABLET | ORAL | Status: AC | PRN
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
## 2015-05-20 NOTE — Patient Instructions (Signed)
Continue cam walker boot  Weight bear as tolerated with crutches

## 2015-06-03 ENCOUNTER — Ambulatory Visit (INDEPENDENT_AMBULATORY_CARE_PROVIDER_SITE_OTHER): Payer: Self-pay

## 2015-06-03 ENCOUNTER — Ambulatory Visit (INDEPENDENT_AMBULATORY_CARE_PROVIDER_SITE_OTHER): Payer: Self-pay | Admitting: Orthopedic Surgery

## 2015-06-03 VITALS — BP 115/73 | Ht 66.0 in | Wt 135.0 lb

## 2015-06-03 DIAGNOSIS — S82892A Other fracture of left lower leg, initial encounter for closed fracture: Secondary | ICD-10-CM

## 2015-06-03 DIAGNOSIS — S8262XD Displaced fracture of lateral malleolus of left fibula, subsequent encounter for closed fracture with routine healing: Secondary | ICD-10-CM

## 2015-06-03 MED ORDER — HYDROCODONE-ACETAMINOPHEN 7.5-325 MG PO TABS: 1.0000 | ORAL_TABLET | ORAL | Status: AC | PRN

## 2015-06-03 NOTE — Progress Notes (Signed)
FRACTURE CARE   Patient ID: Jose Blair, male   DOB: 1991-09-25, 24 y.o.   MRN: [id number]  Chief Complaint  Patient presents with  . Follow-up    2 week follow up + xray left ankle fx, DOI 05/14/15    DX  Encounter Diagnoses  Name Primary?  Marland Kitchen. Ankle fracture, left Yes  . Lateral malleolar fracture, left, closed, with routine healing, subsequent encounter      TREATMENT  Cam Walker  PAIN MEDS:  Percocet switching to Norco 7.5 today  WEIGHT BEARING STATUS  As tolerated  XRAYS  Fracture position is in acceptable alignment healing appropriately  EXAM  Mild tenderness over the fracture site, foot alignment normal  BP 115/73 mmHg  Ht 5\' 6"  (1.676 m)  Wt 135 lb (61.236 kg)  BMI 21.80 kg/m2      ASSESSMENT AND PLAN    three-week x-ray left ankle  Meds ordered this encounter  Medications  . HYDROcodone-acetaminophen (NORCO) 7.5-325 MG per tablet    Sig: Take 1 tablet by mouth every 4 (four) hours as needed for moderate pain.    Dispense:  120 tablet    Refill:  0

## 2015-06-24 ENCOUNTER — Encounter: Payer: Self-pay | Admitting: Orthopedic Surgery

## 2015-06-24 ENCOUNTER — Ambulatory Visit (INDEPENDENT_AMBULATORY_CARE_PROVIDER_SITE_OTHER): Payer: Self-pay | Admitting: Orthopedic Surgery

## 2015-06-24 ENCOUNTER — Ambulatory Visit (INDEPENDENT_AMBULATORY_CARE_PROVIDER_SITE_OTHER): Payer: Self-pay

## 2015-06-24 VITALS — BP 113/75 | Ht 66.0 in | Wt 124.0 lb

## 2015-06-24 DIAGNOSIS — S82892A Other fracture of left lower leg, initial encounter for closed fracture: Secondary | ICD-10-CM

## 2015-06-24 DIAGNOSIS — S8262XD Displaced fracture of lateral malleolus of left fibula, subsequent encounter for closed fracture with routine healing: Secondary | ICD-10-CM

## 2015-06-24 MED ORDER — HYDROCODONE-ACETAMINOPHEN 5-325 MG PO TABS: 1.0000 | ORAL_TABLET | Freq: Three times a day (TID) | ORAL | Status: AC | PRN
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
## 2015-07-24 ENCOUNTER — Ambulatory Visit: Payer: Self-pay | Admitting: Orthopedic Surgery

## 2019-05-13 ENCOUNTER — Other Ambulatory Visit: Payer: Self-pay

## 2019-05-13 ENCOUNTER — Emergency Department (HOSPITAL_COMMUNITY)
Admission: EM | Admit: 2019-05-13 | Discharge: 2019-05-13 | Disposition: A | Discharge status: Home / Self Care | Payer: Self-pay | Attending: Emergency Medicine | Admitting: Emergency Medicine

## 2019-05-13 ENCOUNTER — Emergency Department (HOSPITAL_COMMUNITY): Payer: Self-pay

## 2019-05-13 ENCOUNTER — Encounter (HOSPITAL_COMMUNITY): Payer: Self-pay | Admitting: Emergency Medicine

## 2019-05-13 DIAGNOSIS — M79641 Pain in right hand: Secondary | ICD-10-CM | POA: Insufficient documentation

## 2019-05-13 DIAGNOSIS — Y92410 Unspecified street and highway as the place of occurrence of the external cause: Secondary | ICD-10-CM | POA: Insufficient documentation

## 2019-05-13 DIAGNOSIS — M545 Low back pain, unspecified: Secondary | ICD-10-CM

## 2019-05-13 DIAGNOSIS — Y999 Unspecified external cause status: Secondary | ICD-10-CM | POA: Insufficient documentation

## 2019-05-13 DIAGNOSIS — Y9389 Activity, other specified: Secondary | ICD-10-CM | POA: Insufficient documentation

## 2019-05-13 DIAGNOSIS — F17210 Nicotine dependence, cigarettes, uncomplicated: Secondary | ICD-10-CM | POA: Insufficient documentation

## 2019-05-13 MED ORDER — ACETAMINOPHEN 500 MG PO TABS
1000.0000 mg | ORAL_TABLET | Freq: Once | ORAL | Status: AC
  Administered 2019-05-13: 1000 mg via ORAL
  Filled 2019-05-13: qty 2

## 2019-05-13 MED ORDER — OXYCODONE HCL 5 MG PO TABS
5.0000 mg | ORAL_TABLET | Freq: Once | ORAL | Status: AC
  Administered 2019-05-13: 5 mg via ORAL
  Filled 2019-05-13: qty 1

## 2019-05-13 MED ORDER — KETOROLAC TROMETHAMINE 60 MG/2ML IM SOLN
15.0000 mg | Freq: Once | INTRAMUSCULAR | Status: AC
  Administered 2019-05-13: 15 mg via INTRAMUSCULAR
  Filled 2019-05-13: qty 2
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
## 2019-05-13 NOTE — Discharge Instructions (Signed)
Take 4 over the counter ibuprofen tablets 3 times a day or 2 over-the-counter naproxen tablets twice a day for pain. Also take tylenol 1000mg (2 extra strength) four times a day.   Your xrays were without fracture. Follow up with your family doctor.  Return for shortness of breath or abdominal pain.  If your pain persists for >1 week your family doc may want to repeat xrays.
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
## 2021-12-30 ENCOUNTER — Emergency Department (HOSPITAL_COMMUNITY)
Admission: EM | Admit: 2021-12-30 | Discharge: 2021-12-30 | Discharge status: Against Medical Advice | Payer: Self-pay | Attending: Emergency Medicine | Admitting: Emergency Medicine

## 2021-12-30 ENCOUNTER — Other Ambulatory Visit: Payer: Self-pay

## 2021-12-30 ENCOUNTER — Encounter (HOSPITAL_COMMUNITY): Payer: Self-pay | Admitting: *Deleted

## 2021-12-30 DIAGNOSIS — X500XXA Overexertion from strenuous movement or load, initial encounter: Secondary | ICD-10-CM | POA: Insufficient documentation

## 2021-12-30 DIAGNOSIS — Z5321 Procedure and treatment not carried out due to patient leaving prior to being seen by health care provider: Secondary | ICD-10-CM | POA: Insufficient documentation

## 2021-12-30 DIAGNOSIS — M545 Low back pain, unspecified: Secondary | ICD-10-CM | POA: Insufficient documentation

## 2021-12-30 NOTE — ED Triage Notes (Signed)
Pt with lower back pain for past few weeks. Pt states he does a lot of heavy lifting at work.

## 2023-02-01 ENCOUNTER — Ambulatory Visit: Payer: Self-pay | Admitting: Internal Medicine

## 2023-05-08 DIAGNOSIS — F1490 Cocaine use, unspecified, uncomplicated: Secondary | ICD-10-CM | POA: Diagnosis not present

## 2023-05-08 DIAGNOSIS — F17210 Nicotine dependence, cigarettes, uncomplicated: Secondary | ICD-10-CM | POA: Diagnosis not present

## 2023-05-08 DIAGNOSIS — T405X1A Poisoning by cocaine, accidental (unintentional), initial encounter: Secondary | ICD-10-CM | POA: Diagnosis not present

## 2023-05-08 DIAGNOSIS — Y92009 Unspecified place in unspecified non-institutional (private) residence as the place of occurrence of the external cause: Secondary | ICD-10-CM | POA: Diagnosis not present

## 2024-04-22 ENCOUNTER — Encounter (HOSPITAL_COMMUNITY): Payer: Self-pay

## 2024-04-22 ENCOUNTER — Other Ambulatory Visit: Payer: Self-pay

## 2024-04-22 ENCOUNTER — Emergency Department (HOSPITAL_COMMUNITY): Payer: Self-pay

## 2024-04-22 ENCOUNTER — Emergency Department (HOSPITAL_COMMUNITY)
Admission: EM | Admit: 2024-04-22 | Discharge: 2024-04-22 | Disposition: A | Discharge status: Home / Self Care | Payer: Self-pay | Attending: Emergency Medicine | Admitting: Emergency Medicine

## 2024-04-22 DIAGNOSIS — S20211A Contusion of right front wall of thorax, initial encounter: Secondary | ICD-10-CM | POA: Insufficient documentation

## 2024-04-22 DIAGNOSIS — S0012XA Contusion of left eyelid and periocular area, initial encounter: Secondary | ICD-10-CM | POA: Insufficient documentation

## 2024-04-22 DIAGNOSIS — S298XXA Other specified injuries of thorax, initial encounter: Secondary | ICD-10-CM

## 2024-04-22 DIAGNOSIS — R109 Unspecified abdominal pain: Secondary | ICD-10-CM | POA: Insufficient documentation

## 2024-04-22 LAB — CBC WITH DIFFERENTIAL/PLATELET
Abs Immature Granulocytes: 0 10*3/uL (ref 0.00–0.07)
Basophils Absolute: 0 10*3/uL (ref 0.0–0.1)
Basophils Relative: 1 %
Eosinophils Absolute: 0.1 10*3/uL (ref 0.0–0.5)
Eosinophils Relative: 2 %
HCT: 41.9 % (ref 39.0–52.0)
Hemoglobin: 14 g/dL (ref 13.0–17.0)
Immature Granulocytes: 0 %
Lymphocytes Relative: 40 %
Lymphs Abs: 1.8 10*3/uL (ref 0.7–4.0)
MCH: 31 pg (ref 26.0–34.0)
MCHC: 33.4 g/dL (ref 30.0–36.0)
MCV: 92.7 fL (ref 80.0–100.0)
Monocytes Absolute: 0.4 10*3/uL (ref 0.1–1.0)
Monocytes Relative: 9 %
Neutro Abs: 2.2 10*3/uL (ref 1.7–7.7)
Neutrophils Relative %: 48 %
Platelets: 271 10*3/uL (ref 150–400)
RBC: 4.52 MIL/uL (ref 4.22–5.81)
RDW: 12.8 % (ref 11.5–15.5)
WBC: 4.5 10*3/uL (ref 4.0–10.5)
nRBC: 0 % (ref 0.0–0.2)

## 2024-04-22 LAB — COMPREHENSIVE METABOLIC PANEL WITH GFR
ALT: 35 U/L (ref 0–44)
AST: 40 U/L (ref 15–41)
Albumin: 4 g/dL (ref 3.5–5.0)
Alkaline Phosphatase: 59 U/L (ref 38–126)
Anion gap: 8 (ref 5–15)
BUN: 9 mg/dL (ref 6–20)
CO2: 26 mmol/L (ref 22–32)
Calcium: 9.2 mg/dL (ref 8.9–10.3)
Chloride: 101 mmol/L (ref 98–111)
Creatinine, Ser: 1.12 mg/dL (ref 0.61–1.24)
GFR, Estimated: >60 mL/min (ref >60)
Glucose, Bld: 87 mg/dL (ref 70–99)
Potassium: 4 mmol/L (ref 3.5–5.1)
Sodium: 135 mmol/L (ref 135–145)
Total Bilirubin: 0.9 mg/dL (ref 0.0–1.2)
Total Protein: 7 g/dL (ref 6.5–8.1)

## 2024-04-22 MED ORDER — METHOCARBAMOL 750 MG PO TABS: 750.0000 mg | ORAL_TABLET | Freq: Three times a day (TID) | ORAL | 0 refills | Status: AC

## 2024-04-22 MED ORDER — LIDOCAINE 5 % EX PTCH
1.0000 | MEDICATED_PATCH | CUTANEOUS | Status: DC
  Administered 2024-04-22: 1 via TRANSDERMAL
  Filled 2024-04-22: qty 1

## 2024-04-22 MED ORDER — OXYCODONE-ACETAMINOPHEN 5-325 MG PO TABS
1.0000 | ORAL_TABLET | Freq: Once | ORAL | Status: AC
  Administered 2024-04-22: 1 via ORAL
  Filled 2024-04-22: qty 1

## 2024-04-22 MED ORDER — IOHEXOL 300 MG/ML  SOLN
100.0000 mL | Freq: Once | INTRAMUSCULAR | Status: AC | PRN
  Administered 2024-04-22: 100 mL via INTRAVENOUS

## 2024-04-22 MED ORDER — NAPROXEN 500 MG PO TABS: 500.0000 mg | ORAL_TABLET | Freq: Two times a day (BID) | ORAL | 0 refills | Status: AC

## 2024-04-22 MED ORDER — LIDOCAINE 5 % EX PTCH: 1.0000 | MEDICATED_PATCH | CUTANEOUS | 0 refills | Status: AC

## 2024-04-22 NOTE — ED Notes (Signed)
 Pt/family received d/c paperwork at this time. After going over the paperwork any questions, comments, or concerns were answered to the best of this nurse's knowledge. The pt/family verbally acknowledged the teachings/instructions.
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
## 2024-04-22 NOTE — Discharge Instructions (Addendum)
 Please follow-up closely with a primary care doctor on an outpatient basis.  Return to emergency department immediately for any new or worsening symptoms.  [facility] Primary Care Doctor List    Syliva Overman, MD. Specialty: [facility] Medicine Contact information: [address]  [place] [address]  [phone]   Lilyan Punt, MD. Specialty: [facility] Medicine Contact information: [address]  [place] [address]  [phone]   Avon Gully, MD Specialty: Internal Medicine Contact information: [address] [place] [address]  [phone]   Catalina Pizza, MD. Specialty: Internal Medicine Contact information: [address]  [place] [address]  [phone]    [facility] (Dr. Selena Batten) Specialty: Family Medicine Contact information: [address]  [place] [address]  [phone]   John Giovanni, MD. Specialty: [facility] Medicine Contact information: [address]  [address]  [place] [address]  [phone]   Carylon Perches, MD. Specialty: Internal Medicine Contact information: [address]  [address]  [place] [address]  [phone]   [facility] Family Medicine: [address] [phone]  [place], Family medicine [address]  [phone]  [facility] [address] [place], [place] [phone]  [facility]: [address] [phone]    [facility] - Benita Stabile  [address] [place], [place] [address] [phone]  Services [facility] offers a variety of basic health services.  Services include but are not limited to: Blood pressure checks  Heart rate checks  Blood sugar checks  Urine analysis  Rapid strep tests  Pregnancy tests.  Health education and referrals  People needing more complex services will be directed to a physician online. Using these virtual visits, doctors can evaluate  and prescribe medicine and treatments. There will be no medication on-site, though [facility] will help patients fill their prescriptions at little to no cost.   For More information please go to: [email or web address]  Allergy and Asthma:    [address] [place] Ace [phone]  Urology:  [address]  [place] [phone]  [facility]  [address] [place], [place] [phone]  Orthopedics   [address] [place], [place] [phone]  Endocrinology  [address] [place], [place] [phone]  Podiatry: [facility] [phone]
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
